# Patient Record
Sex: Female | Born: 2000
Health system: Southern US, Community
[De-identification: ages and names within clinical notes are randomized; demographics above are authoritative.]

## PROBLEM LIST (undated history)

## (undated) DIAGNOSIS — F980 Enuresis not due to a substance or known physiological condition: Secondary | ICD-10-CM

## (undated) DIAGNOSIS — K5090 Crohn's disease, unspecified, without complications: Secondary | ICD-10-CM

## (undated) DIAGNOSIS — R0683 Snoring: Secondary | ICD-10-CM

## (undated) DIAGNOSIS — D649 Anemia, unspecified: Secondary | ICD-10-CM

## (undated) DIAGNOSIS — T7840XA Allergy, unspecified, initial encounter: Secondary | ICD-10-CM

## (undated) HISTORY — DX: Allergy, unspecified, initial encounter: T78.40XA

## (undated) HISTORY — PX: DENTAL SURGERY: SHX609

## (undated) HISTORY — DX: Anemia, unspecified: D64.9

## (undated) HISTORY — DX: Enuresis not due to a substance or known physiological condition: F98.0

## (undated) HISTORY — DX: Snoring: R06.83

---
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
## 2021-01-07 DIAGNOSIS — L089 Local infection of the skin and subcutaneous tissue, unspecified: Secondary | ICD-10-CM | POA: Diagnosis not present

## 2021-01-07 DIAGNOSIS — Z13220 Encounter for screening for lipoid disorders: Secondary | ICD-10-CM | POA: Diagnosis not present

## 2021-01-07 DIAGNOSIS — N898 Other specified noninflammatory disorders of vagina: Secondary | ICD-10-CM | POA: Diagnosis not present

## 2021-01-07 DIAGNOSIS — K5010 Crohn's disease of large intestine without complications: Secondary | ICD-10-CM | POA: Diagnosis not present

## 2021-01-07 DIAGNOSIS — Z1159 Encounter for screening for other viral diseases: Secondary | ICD-10-CM | POA: Diagnosis not present

## 2021-01-07 DIAGNOSIS — D509 Iron deficiency anemia, unspecified: Secondary | ICD-10-CM | POA: Diagnosis not present

## 2021-01-07 DIAGNOSIS — K122 Cellulitis and abscess of mouth: Secondary | ICD-10-CM | POA: Diagnosis not present

## 2021-01-07 DIAGNOSIS — Z0000 Encounter for general adult medical examination without abnormal findings: Secondary | ICD-10-CM | POA: Diagnosis not present

## 2021-01-08 DIAGNOSIS — N898 Other specified noninflammatory disorders of vagina: Secondary | ICD-10-CM | POA: Diagnosis not present

## 2021-03-05 ENCOUNTER — Ambulatory Visit: Payer: Medicaid Other | Admitting: Pediatrics

## 2021-03-19 ENCOUNTER — Encounter: Payer: Self-pay | Admitting: Pediatrics

## 2021-03-19 ENCOUNTER — Ambulatory Visit (INDEPENDENT_AMBULATORY_CARE_PROVIDER_SITE_OTHER): Payer: BC Managed Care – PPO | Admitting: Pediatrics

## 2021-03-19 ENCOUNTER — Other Ambulatory Visit: Payer: Self-pay

## 2021-03-19 VITALS — BP 112/73 | HR 75 | Ht 68.0 in | Wt 155.6 lb

## 2021-03-19 DIAGNOSIS — Z3046 Encounter for surveillance of implantable subdermal contraceptive: Secondary | ICD-10-CM | POA: Diagnosis not present

## 2021-03-19 DIAGNOSIS — F4322 Adjustment disorder with anxiety: Secondary | ICD-10-CM | POA: Diagnosis not present

## 2021-03-19 MED ORDER — LEVONORGESTREL 1.5 MG PO TABS: 1.5000 mg | ORAL_TABLET | Freq: Once | ORAL | 0 refills | Status: AC

## 2021-03-19 NOTE — Patient Instructions (Signed)
Let Sonya Malone know if your mood symptoms aren't improving!   Your Nexplanon was removed today and is no longer preventing pregnancy.  If you have sex, remember to use condoms to prevent pregnancy and to prevent sexually transmitted infections.  Leave the outside bandage on for 24 hours.  Leave the smaller bandages on for 3-5 days or until they fall off on their own.  Keep the area clean and dry for 3-5 days.  There is usually bruising or swelling at and around the removal site for a few days to a week after the removal.  If you see redness or pus draining from the removal site, call Sonya Malone immediately.  We would like you to return to the clinic for a follow-up visit in 1 month.  You can call [facility] 24 hours a day with any questions or concerns.  There is always a nurse or doctor available to take your call.  Call 9-1-1 if you have a life-threatening emergency.  For anything else, please call Sonya Malone at [phone] before heading to the ER.

## 2021-03-19 NOTE — Progress Notes (Signed)
History was provided by the patient.  Sonya Malone is a 20 y.o. female who is here for nexplanon removal.  [name], Andres, MD   HPI:  Pt reports she feels like nexplanon is causing some anxiety and depression symptoms. Not currently sexually active. Last was July. Wants to take break from hormones and see how things are. Would like plan B at home if she needs it.   Was seeing therapist but it was not a good fit. Not taking SSRI- made her super numb. Working at [employer] last menstrual period was 03/16/2021 (exact date).    Patient Active Problem List   Diagnosis Date Noted   Crohn's disease of small intestine without complication ([place]) 81/15/7262   Nexplanon in place 04/27/2017   Adjustment disorder with anxious mood    Terminal ileitis ([place]) 08/03/2016    Current Outpatient Medications on File Prior to Visit  Medication Sig Dispense Refill   albuterol (VENTOLIN HFA) 108 (90 Base) MCG/ACT inhaler Inhale 2 puffs into the lungs every 4 (four) hours as needed for wheezing. 2 each 1   Clindamycin-Benzoyl Per, Refr, gel Apply 1 application topically 2 (two) times daily. 45 g 3   clindamycin-benzoyl peroxide (BENZACLIN) gel Apply 1 application topically 2 (two) times daily as needed (unk). 25 g 12   etonogestrel (NEXPLANON) 68 MG IMPL implant 1 each by Subdermal route once.     mupirocin ointment (BACTROBAN) 2 % Apply 1 application topically 3 (three) times daily. 22 g 0   VITAMIN D PO Take 50,000 Units by mouth once a week.      ferrous sulfate 325 (65 FE) MG EC tablet Take 1 tablet by mouth every morning.     No current facility-administered medications on file prior to visit.    No Known Allergies  Physical Exam:    Vitals:   03/19/21 1005  BP: 112/73  Pulse: 75  Weight: 155 lb 9.6 oz (70.6 kg)  Height: 5' 8"  (1.727 m)    Blood pressure percentiles are not available for patients who are 18 years or older.  Physical Exam Vitals and nursing note reviewed.   Constitutional:      General: She is not in acute distress.    Appearance: She is well-developed.  Neck:     Thyroid: No thyromegaly.  Cardiovascular:     Rate and Rhythm: Normal rate and regular rhythm.     Heart sounds: No murmur heard. Pulmonary:     Breath sounds: Normal breath sounds.  Abdominal:     Palpations: Abdomen is soft. There is no mass.     Tenderness: There is no abdominal tenderness. There is no guarding.  Musculoskeletal:     Right lower leg: No edema.     Left lower leg: No edema.  Lymphadenopathy:     Cervical: No cervical adenopathy.  Skin:    General: Skin is warm.     Findings: No rash.  Neurological:     Mental Status: She is alert.     Comments: No tremor    Assessment/Plan: 1. Encounter for Nexplanon removal See procedure note. Tolerated well without issues. Plan b sent to the pharmacy if needed.   2. Adjustment disorder with anxious mood Advised if she continues to struggle with mood and anxiety to come back and see [place].   Return PRN.   Jonathon Resides, FNP

## 2021-03-19 NOTE — Progress Notes (Signed)
Risks & benefits of Nexplanon removal discussed. Consent form signed.  The patient denies any allergies to anesthetics or antiseptics.  Procedure: Pt was placed in supine position. left arm was flexed at the elbow and externally rotated so that her wrist was parallel to her ear, The device was palpated and marked. The site was cleaned with Betadine. The area surrounding the device was covered with a sterile drape. 1% lidocaine was injected just under the device. A scalpel was used to create a small incision. The device was pushed towards the incision. Fibrous tissue surrounding the device was gradually removed from the device. The device was removed and measured to ensure all 4 cm of device was removed. Steri-strips were used to close the incision. Pressure dressing was applied to the patient.  The patient was instructed to removed the pressure dressing in 24 hrs.  The patient was advised to move slowly from a supine to an upright position  The patient denied any concerns or complaints  The patient was instructed to schedule a follow-up appt in 1 month. The patient will be called in 1 week to address any concerns.

## 2021-08-31 DIAGNOSIS — Z30011 Encounter for initial prescription of contraceptive pills: Secondary | ICD-10-CM | POA: Diagnosis not present

## 2021-08-31 DIAGNOSIS — R41840 Attention and concentration deficit: Secondary | ICD-10-CM | POA: Diagnosis not present

## 2021-11-30 ENCOUNTER — Encounter: Payer: Self-pay | Admitting: Pediatrics

## 2021-12-07 DIAGNOSIS — J358 Other chronic diseases of tonsils and adenoids: Secondary | ICD-10-CM | POA: Diagnosis not present

## 2021-12-07 DIAGNOSIS — J029 Acute pharyngitis, unspecified: Secondary | ICD-10-CM | POA: Diagnosis not present

## 2021-12-07 DIAGNOSIS — J020 Streptococcal pharyngitis: Secondary | ICD-10-CM | POA: Diagnosis not present

## 2022-02-24 DIAGNOSIS — J0391 Acute recurrent tonsillitis, unspecified: Secondary | ICD-10-CM | POA: Diagnosis not present

## 2022-04-16 DIAGNOSIS — J384 Edema of larynx: Secondary | ICD-10-CM | POA: Diagnosis not present

## 2022-04-16 DIAGNOSIS — J353 Hypertrophy of tonsils with hypertrophy of adenoids: Secondary | ICD-10-CM | POA: Diagnosis not present

## 2022-04-16 DIAGNOSIS — J358 Other chronic diseases of tonsils and adenoids: Secondary | ICD-10-CM | POA: Diagnosis not present

## 2022-07-06 DIAGNOSIS — J3501 Chronic tonsillitis: Secondary | ICD-10-CM | POA: Diagnosis not present

## 2022-07-06 DIAGNOSIS — J358 Other chronic diseases of tonsils and adenoids: Secondary | ICD-10-CM | POA: Diagnosis not present

## 2022-07-06 DIAGNOSIS — J353 Hypertrophy of tonsils with hypertrophy of adenoids: Secondary | ICD-10-CM | POA: Diagnosis not present

## 2022-12-27 DIAGNOSIS — L700 Acne vulgaris: Secondary | ICD-10-CM | POA: Diagnosis not present

## 2022-12-28 ENCOUNTER — Encounter: Payer: Self-pay | Admitting: Pediatrics

## 2022-12-29 DIAGNOSIS — L700 Acne vulgaris: Secondary | ICD-10-CM | POA: Diagnosis not present

## 2023-01-10 DIAGNOSIS — Z30430 Encounter for insertion of intrauterine contraceptive device: Secondary | ICD-10-CM | POA: Diagnosis not present

## 2023-01-10 DIAGNOSIS — Z3202 Encounter for pregnancy test, result negative: Secondary | ICD-10-CM | POA: Diagnosis not present

## 2023-01-27 DIAGNOSIS — L700 Acne vulgaris: Secondary | ICD-10-CM | POA: Diagnosis not present

## 2023-03-08 DIAGNOSIS — L700 Acne vulgaris: Secondary | ICD-10-CM | POA: Diagnosis not present

## 2023-03-30 DIAGNOSIS — L700 Acne vulgaris: Secondary | ICD-10-CM | POA: Diagnosis not present

## 2023-04-07 DIAGNOSIS — L700 Acne vulgaris: Secondary | ICD-10-CM | POA: Diagnosis not present

## 2023-04-16 DIAGNOSIS — R0981 Nasal congestion: Secondary | ICD-10-CM | POA: Diagnosis not present

## 2023-04-16 DIAGNOSIS — R051 Acute cough: Secondary | ICD-10-CM | POA: Diagnosis not present

## 2023-04-16 DIAGNOSIS — J069 Acute upper respiratory infection, unspecified: Secondary | ICD-10-CM | POA: Diagnosis not present

## 2023-05-02 DIAGNOSIS — L700 Acne vulgaris: Secondary | ICD-10-CM | POA: Diagnosis not present

## 2023-05-11 DIAGNOSIS — L700 Acne vulgaris: Secondary | ICD-10-CM | POA: Diagnosis not present

## 2023-06-10 DIAGNOSIS — L700 Acne vulgaris: Secondary | ICD-10-CM | POA: Diagnosis not present

## 2023-06-13 ENCOUNTER — Encounter: Payer: Self-pay | Admitting: Nurse Practitioner

## 2023-06-13 DIAGNOSIS — L700 Acne vulgaris: Secondary | ICD-10-CM | POA: Diagnosis not present

## 2023-06-16 DIAGNOSIS — R768 Other specified abnormal immunological findings in serum: Secondary | ICD-10-CM | POA: Diagnosis not present

## 2023-06-16 DIAGNOSIS — K50019 Crohn's disease of small intestine with unspecified complications: Secondary | ICD-10-CM | POA: Diagnosis not present

## 2023-06-16 DIAGNOSIS — R41840 Attention and concentration deficit: Secondary | ICD-10-CM | POA: Diagnosis not present

## 2023-06-16 DIAGNOSIS — F329 Major depressive disorder, single episode, unspecified: Secondary | ICD-10-CM | POA: Diagnosis not present

## 2023-07-08 DIAGNOSIS — L700 Acne vulgaris: Secondary | ICD-10-CM | POA: Diagnosis not present

## 2023-07-14 DIAGNOSIS — L700 Acne vulgaris: Secondary | ICD-10-CM | POA: Diagnosis not present

## 2023-07-18 DIAGNOSIS — Z6825 Body mass index (BMI) 25.0-25.9, adult: Secondary | ICD-10-CM | POA: Diagnosis not present

## 2023-07-18 DIAGNOSIS — F411 Generalized anxiety disorder: Secondary | ICD-10-CM | POA: Diagnosis not present

## 2023-07-18 DIAGNOSIS — F329 Major depressive disorder, single episode, unspecified: Secondary | ICD-10-CM | POA: Diagnosis not present

## 2023-07-27 NOTE — Progress Notes (Unsigned)
 07/27/2023 Sonya Malone [id number] [birth year]   CHIEF COMPLAINT: Crohn's disease   HISTORY OF PRESENT ILLNESS: Sonya Malone is a 23 year old female with a past medical history of anxiety, depression and Crohn's disease diagnosed in 2018 initially treated with Remicade which eventually required laparoscopic ileocecectomy with primarily ileocolic anastomosis 09/10/2019 (approximately 10 cm of small intestine resected with the proximal 5 cm of ileum and 10 cm of small intestine and the terminal ileum appeared to be grossly involved). She presents to our office today to establish a new GI for Crohn's management. She has not seen a GI since she underwent ileocecectomy surgery 08/2019 at [facility]. She is accompanied by her mother.   She denies having any nausea or vomiting. No abdominal pain.   No NSAIDs  She was due for 6 month follow up post surgery.  Age 76.   She had Remicade prior to surgery, stopped working 2020.   No abdominal pain She passed instant diarrhea after eating or water x 1 yr.  Now, snake like stools, more formed stool debris like stools.  Milk or coffee increases BM faster Loose stool more than formed. Watery more than solid. 1 to 2 episodes daily, bad day 5 episodes once weekly.  She is able to eat a healthy diet.  Weight up a little 160 - 165 lbs  Flu fall 2024, a few [place], Allergies. No antibiotics     EGD 08/06/2016 by Dr. Adelene Amas: - Normal esophagus. Biopsied.  - Gastritis. Biopsied.  - No gross lesions in the second portion of the duodenum. Biopsied.  Colonoscopy 08/06/2016: - Erythematous mucosa in the cecum. Terminal ileum protruding into cecum with ulcer seen. Likely crohn's. Biopsied.  - The recto-sigmoid colon is normal. Biopsied.  - The descending colon is normal. Biopsied. - The transverse colon is normal. Biopsied. - The ascending colon was normal.  1. Duodenum, Biopsy - NORMAL DUODENAL MUCOSA. - NO FEATURES OF SPRUE, ACTIVE INFLAMMATION  OR GRANULOMAS. 2. Stomach, biopsy, Antral - ANTRAL MUCOSA WITH MILD CHRONIC GASTRITIS. - WARTHIN-STARRY STAIN NEGATIVE FOR HELICOBACTER PYLORI. - NO INTESTINAL METAPLASIA, DYSPLASIA OR MALIGNANCY. 3. Esophagus, biopsy - NORMAL SQUAMOUS MUCOSA. - NO FEATURES OF EOSINOPHILIC ESOPHAGITIS (LESS THAN 3 EOSINOPHILS PER HIGH POWER FIELD). 4. Colon, biopsy, Cecal - NORMAL COLONIC MUCOSA WITH BENIGN LYMPHOID AGGREGATE. - NO MICROSCOPIC COLITIS, ACTIVE INFLAMMATION OR GRANULOMAS. 5. Colon, biopsy, Transverse - NORMAL COLONIC MUCOSA WITH BENIGN LYMPHOID AGGREGATE. - NO MICROSCOPIC COLITIS, ACTIVE INFLAMMATION OR GRANULOMAS. 6. Colon, biopsy, Descending - NORMAL COLONIC MUCOSA WITH BENIGN LYMPHOID AGGREGATE. - NO MICROSCOPIC COLITIS, ACTIVE INFLAMMATION OR GRANULOMAS. 7. Colon, biopsy, Recto-Sigmoid - BENIGN COLONIC MUCOSA WITH BENIGN LYMPHOID AGGREGATE. - NO MICROSCOPIC COLITIS, ACTIVE INFLAMMATION OR GRANULOMAS.    EGD 08/31/2019 at [facility]: Findings:      The oropharynx was normal.       The examined esophagus was normal. Estimated blood loss was minimal.       Biopsies were taken with a cold forceps for histology.       No gross lesions were noted in the entire examined stomach. A small       patch of erythema/edema noted without bleeding present (<1cm) in the       examined stomach. Otherwise grossly normal. Estimated blood loss was       minimal. Biopsies were taken with a cold forceps for histology.       The examined duodenum was normal. Estimated blood loss was minimal.  Biopsies were taken with a cold forceps for histology.                                                                                Impression:     - Normal oropharynx.                         - Normal esophagus. Biopsied.                         - No gross lesions in the stomach. Biopsied.                         - Normal examined duodenum. Biopsied.    Colonoscopy 08/31/2019 at [facility]: Findings:      Skin tags  were found on perianal exam.       The colon (entire examined portion) appeared normal though poor cleanout       limited evaluation. Estimated blood loss was minimal. Biopsies were       taken with a cold forceps for histology.       A localized area of the ileocecal valve was severely congested and       unable to intubate. Again obscured by stool. Estimated blood loss was       minimal.                                                                                  Impressio      - Preparation of the colon was inadequate.                         - Perianal skin tags found on perianal exam.                         - The entire examined colon is normal. Biopsied.                         - Congested mucosa at the ileocecal valve.      IMAGE STUDIES:  Abdominal/Pelvic  MRI 04/01/2017: LOWER CHEST: Unremarkable.   ABDOMEN/PELVIS:   HEPATOBILIARY: Liver is normal in signal. No biliary ductal dilatation. Gallbladder is unremarkable  PANCREAS: Unremarkable.  SPLEEN: Unremarkable.  ADRENAL GLANDS: Unremarkable.  KIDNEYS/URETERS: Symmetric nephrograms without hydronephrosis.  BLADDER: Unremarkable.  BOWEL/PERITONEUM/RETROPERITONEUM: No evidence of bowel obstruction. Trace free fluid in the pelvis which is likely physiologic. Short segment of hyperenhancement in the terminal ileum spanning approximately 2 to 3 cm, decreased from prior, with improvement in previously seen wall thickening. Restricted diffusion of this area is also seen. No evidence of fistula or abscess formation.  VASCULATURE: Abdominal aorta within normal limits for patient's age. Unremarkable inferior  vena cava.  LYMPH NODES: Grouped lymph nodes in the right lower quadrant without adenopathy by size criteria  REPRODUCTIVE ORGANS: Uterus and ovaries are unremarkable.   BONES/SOFT TISSUES: Unremarkable.    IMPRESSION:  -- Mild enhancement of a short segment in the terminal ileum which is consistent with Crohn's disease,  however, this is greatly improved from prior exam. No evidence of fistula or abscess formation.   Abdominal/Pelvic MRI 518/2018: FINDINGS:  LOWER CHEST: Unremarkable.   ABDOMEN/PELVIS:  - HEPATOBILIARY: Unremarkable liver. No biliary ductal dilatation. Gallbladder is distended with layering sludge.  - PANCREAS: Unremarkable.  - SPLEEN: Unremarkable.  - ADRENAL GLANDS: Unremarkable.   - KIDNEYS/URETERS: No hydronephrosis.  - BLADDER: Unremarkable.   - BOWEL/PERITONEUM/RETROPERITONEUM: No bowel obstruction. No acute inflammatory process. Small volume free fluid within the right lower quadrant. Bowel wall thickening and edema of the terminal ileum spanning a length of approximately 18-22 cm. This is most profound at the terminus, just proximal to the ileocecal valve. The bowel lumen is significantly narrowed in this distal most portion of the terminal ileum, measuring 4-5 cm. Narrowing of the ileal lumen is also seen further proximally suggestive of stricture spanning a length of approximately 3-4 cm. There is also marked wall thickening and enhancement within this region (22:67). There is also likely mild involvement of the cecum at the ileocecal valve. No fluid collections or abscesses are demonstrated.   - VASCULATURE: Abdominal aorta within normal limits for patient's age. The portal vein, SMV, and splenic veins are patent. Unremarkable inferior vena cava.  - LYMPH NODES: Multiple prominent subcentimeter right lower quadrant lymph nodes, likely reactive.   - REPRODUCTIVE ORGANS: The uterus and ovaries are unremarkable.   BONES/SOFT TISSUES: Unremarkable.    IMPRESSION:  1.Bowel wall thickening and edema of the terminal ileum spanning a length of approximately 18 to 22 cm with stricture in the terminal most portion of the ileum, and also likely more proximal stricture spanning a length of approximately 3-4 cm. There is mild involvement of the cecum at the ileocecal valve. Findings are  consistent with history of Crohn's disease.   2.No focal fluid collection or abscess.    Past Medical History:  Diagnosis Date   Crohn's disease (HCC)    Nonorganic enuresis    Snoring 2011   ENT eval for poss OSA   Past Surgical History:  Procedure Laterality Date   COLONOSCOPY WITH PROPOFOL N/A 08/06/2016   Procedure: COLONOSCOPY WITH PROPOFOL;  Surgeon: Adelene Amas, MD;  Location[facility] ENDOSCOPY;  Service: Gastroenterology;  Laterality: N/A;   DENTAL SURGERY     ESOPHAGOGASTRODUODENOSCOPY (EGD) WITH PROPOFOL N/A 08/06/2016   Procedure: ESOPHAGOGASTRODUODENOSCOPY (EGD) WITH PROPOFOL;  Surgeon: Adelene Amas, MD;  Location[facility] ENDOSCOPY;  Service: Gastroenterology;  Laterality: N/A;   Social History:  Family History:    reports that she has never smoked. She has been exposed to tobacco smoke. She has never used smokeless tobacco. She reports that she does not drink alcohol and does not use drugs. family history includes Alcohol abuse in her maternal grandfather; Diabetes in her maternal grandmother; Hearing loss in her maternal grandfather. No Known Allergies    Outpatient Encounter Medications as of 07/28/2023  Medication Sig   albuterol (VENTOLIN HFA) 108 (90 Base) MCG/ACT inhaler Inhale 2 puffs into the lungs every 4 (four) hours as needed for wheezing.   Clindamycin-Benzoyl Per, Refr, gel Apply 1 application topically 2 (two) times daily.   clindamycin-benzoyl peroxide (BENZACLIN) gel Apply 1 application topically 2 (two) times  daily as needed (unk).   ferrous sulfate 325 (65 FE) MG EC tablet Take 1 tablet by mouth every morning.   mupirocin ointment (BACTROBAN) 2 % Apply 1 application topically 3 (three) times daily.   VITAMIN D PO Take 50,000 Units by mouth once a week.    No facility-administered encounter medications on file as of 07/28/2023.     REVIEW OF SYSTEMS:  Gen: Denies fever, sweats or chills. No weight loss.  CV: Denies chest pain, palpitations or  edema. Resp: Denies cough, shortness of breath of hemoptysis.  GI: Denies heartburn, dysphagia, stomach or lower abdominal pain. No diarrhea or constipation.  GU: Denies urinary burning, blood in urine, increased urinary frequency or incontinence. MS: Denies joint pain, muscles aches or weakness. Derm: Denies rash, itchiness, skin lesions or unhealing ulcers. Psych: Denies depression, anxiety, memory loss or confusion. Heme: Denies bruising, easy bleeding. Neuro:  Denies headaches, dizziness or paresthesias. Endo:  Denies any problems with DM, thyroid or adrenal function.  PHYSICAL EXAM: There were no vitals taken for this visit. General: in no acute distress. Head: Normocephalic and atraumatic. Eyes:  Sclerae non-icteric, conjunctive pink. Ears: Normal auditory acuity. Mouth: Dentition intact. No ulcers or lesions.  Neck: Supple, no lymphadenopathy or thyromegaly.  Lungs: Clear bilaterally to auscultation without wheezes, crackles or rhonchi. Heart: Regular rate and rhythm. No murmur, rub or gallop appreciated.  Abdomen: Soft, nontender, nondistended. No masses. No hepatosplenomegaly. Normoactive bowel sounds x 4 quadrants.  Rectal:  Musculoskeletal: Symmetrical with no gross deformities. Skin: Warm and dry. No rash or lesions on visible extremities. Extremities: No edema. Neurological: Alert oriented x 4, no focal deficits.  Psychological:  Alert and cooperative. Normal mood and affect.  ASSESSMENT AND PLAN:    CC:  Georgiann Hahn, MD

## 2023-07-28 ENCOUNTER — Other Ambulatory Visit (INDEPENDENT_AMBULATORY_CARE_PROVIDER_SITE_OTHER)

## 2023-07-28 ENCOUNTER — Ambulatory Visit: Payer: BC Managed Care – PPO | Admitting: Nurse Practitioner

## 2023-07-28 ENCOUNTER — Encounter: Payer: Self-pay | Admitting: Nurse Practitioner

## 2023-07-28 VITALS — BP 104/70 | HR 91 | Ht 68.0 in | Wt 171.0 lb

## 2023-07-28 DIAGNOSIS — E559 Vitamin D deficiency, unspecified: Secondary | ICD-10-CM

## 2023-07-28 DIAGNOSIS — K5090 Crohn's disease, unspecified, without complications: Secondary | ICD-10-CM

## 2023-07-28 LAB — CBC WITH DIFFERENTIAL/PLATELET
Basophils Absolute: 0 10*3/uL (ref 0.0–0.1)
Basophils Relative: 0.4 % (ref 0.0–3.0)
Eosinophils Absolute: 0.2 10*3/uL (ref 0.0–0.7)
Eosinophils Relative: 2.6 % (ref 0.0–5.0)
HCT: 38.9 % (ref 36.0–46.0)
Hemoglobin: 12.3 g/dL (ref 12.0–15.0)
Lymphocytes Relative: 20.8 % (ref 12.0–46.0)
Lymphs Abs: 1.5 10*3/uL (ref 0.7–4.0)
MCHC: 31.6 g/dL (ref 30.0–36.0)
MCV: 78.2 fl (ref 78.0–100.0)
Monocytes Absolute: 0.5 10*3/uL (ref 0.1–1.0)
Monocytes Relative: 7 % (ref 3.0–12.0)
Neutro Abs: 5.1 10*3/uL (ref 1.4–7.7)
Neutrophils Relative %: 69.2 % (ref 43.0–77.0)
Platelets: 246 10*3/uL (ref 150.0–400.0)
RBC: 4.98 Mil/uL (ref 3.87–5.11)
RDW: 15.1 % (ref 11.5–15.5)
WBC: 7.4 10*3/uL (ref 4.0–10.5)

## 2023-07-28 LAB — COMPREHENSIVE METABOLIC PANEL WITH GFR
ALT: 34 U/L (ref 0–35)
AST: 22 U/L (ref 0–37)
Albumin: 4.8 g/dL (ref 3.5–5.2)
Alkaline Phosphatase: 63 U/L (ref 39–117)
BUN: 10 mg/dL (ref 6–23)
CO2: 25 meq/L (ref 19–32)
Calcium: 9.5 mg/dL (ref 8.4–10.5)
Chloride: 105 meq/L (ref 96–112)
Creatinine, Ser: 0.7 mg/dL (ref 0.40–1.20)
GFR: 122.94 mL/min (ref >60.00)
Glucose, Bld: 92 mg/dL (ref 70–99)
Potassium: 4.4 meq/L (ref 3.5–5.1)
Sodium: 140 meq/L (ref 135–145)
Total Bilirubin: 0.4 mg/dL (ref 0.2–1.2)
Total Protein: 7.5 g/dL (ref 6.0–8.3)

## 2023-07-28 LAB — IBC + FERRITIN
Ferritin: 4.3 ng/mL — ABNORMAL LOW (ref 10.0–291.0)
Iron: 31 ug/dL — ABNORMAL LOW (ref 42–145)
Saturation Ratios: 5.9 % — ABNORMAL LOW (ref 20.0–50.0)
TIBC: 522.2 ug/dL — ABNORMAL HIGH (ref 250.0–450.0)
Transferrin: 373 mg/dL — ABNORMAL HIGH (ref 212.0–360.0)

## 2023-07-28 LAB — C-REACTIVE PROTEIN: CRP: 1 mg/dL (ref 0.5–20.0)

## 2023-07-28 LAB — SEDIMENTATION RATE: Sed Rate: 19 mm/h (ref 0–20)

## 2023-07-28 LAB — VITAMIN D 25 HYDROXY (VIT D DEFICIENCY, FRACTURES): VITD: 13.89 ng/mL — ABNORMAL LOW (ref 30.00–100.00)

## 2023-07-28 NOTE — Patient Instructions (Addendum)
 Your provider has requested that you go to the basement level for lab work before leaving today. Press "B" on the elevator. The lab is located at the first door on the left as you exit the elevator.  Further recommendations to be determined once lab results are reviewed.   Due to recent changes in healthcare laws, you may see the results of your imaging and laboratory studies on MyChart before your provider has had a chance to review them.  We understand that in some cases there may be results that are confusing or concerning to you. Not all laboratory results come back in the same time frame and the provider may be waiting for multiple results in order to interpret others.  Please give [place] 48 hours in order for your provider to thoroughly review all the results before contacting the office for clarification of your results.   Thank you for trusting me with your gastrointestinal care!   Alcide Evener, CRNP

## 2023-08-01 ENCOUNTER — Other Ambulatory Visit

## 2023-08-01 DIAGNOSIS — K5090 Crohn's disease, unspecified, without complications: Secondary | ICD-10-CM | POA: Diagnosis not present

## 2023-08-03 LAB — CALPROTECTIN, FECAL: Calprotectin, Fecal: 117 ug/g (ref 0–120)

## 2023-08-08 NOTE — Addendum Note (Signed)
 Addended by: Corine Dice on: 08/08/2023 08:08 AM   Modules accepted: Orders

## 2023-08-08 NOTE — Progress Notes (Signed)
Orders were entered.

## 2023-08-11 NOTE — Progress Notes (Signed)
 Attempted to contact patient and left a voicemail for patient to return call.

## 2023-08-16 DIAGNOSIS — L700 Acne vulgaris: Secondary | ICD-10-CM | POA: Diagnosis not present

## 2023-08-21 NOTE — Progress Notes (Signed)
 Addendum: Reviewed and agree with assessment and management plan. New patient with history of ileal Crohn's disease status post resection.  No longer on biologic therapy postsurgery. Her fecal calprotectin is on the high end of borderline high I am suspicious for recurrence Would recommend MR enterography and colonoscopy if patient agreeable Yvaine Jankowiak, Amber Bail, MD

## 2023-08-21 NOTE — Progress Notes (Signed)
 Linda/Dottie, pls contact patient and let her know Dr. Marietta Shorter recommendation noted in addendum below. Pls schedule her for MR enterography and colonoscopy a colonoscopy at [facility] if she is agreeable to do so. THX.

## 2023-08-22 ENCOUNTER — Telehealth: Payer: Self-pay

## 2023-08-22 ENCOUNTER — Other Ambulatory Visit: Payer: Self-pay

## 2023-08-22 DIAGNOSIS — K5090 Crohn's disease, unspecified, without complications: Secondary | ICD-10-CM

## 2023-08-22 NOTE — Telephone Encounter (Signed)
 Pt notified via mychart. MR entero ordered. Awaiting comm back from pt to see if ok to proceed with colon.

## 2023-08-29 DIAGNOSIS — K50019 Crohn's disease of small intestine with unspecified complications: Secondary | ICD-10-CM | POA: Diagnosis not present

## 2023-08-29 DIAGNOSIS — Z6825 Body mass index (BMI) 25.0-25.9, adult: Secondary | ICD-10-CM | POA: Diagnosis not present

## 2023-08-29 DIAGNOSIS — F329 Major depressive disorder, single episode, unspecified: Secondary | ICD-10-CM | POA: Diagnosis not present

## 2023-08-29 DIAGNOSIS — F411 Generalized anxiety disorder: Secondary | ICD-10-CM | POA: Diagnosis not present

## 2023-09-02 ENCOUNTER — Ambulatory Visit (HOSPITAL_COMMUNITY)

## 2023-09-02 ENCOUNTER — Ambulatory Visit (HOSPITAL_COMMUNITY): Admission: RE | Admit: 2023-09-02 | Source: Ambulatory Visit

## 2023-09-08 ENCOUNTER — Ambulatory Visit (AMBULATORY_SURGERY_CENTER)

## 2023-09-08 VITALS — Ht 69.0 in | Wt 165.0 lb

## 2023-09-08 DIAGNOSIS — K5090 Crohn's disease, unspecified, without complications: Secondary | ICD-10-CM

## 2023-09-08 MED ORDER — NA SULFATE-K SULFATE-MG SULF 17.5-3.13-1.6 GM/177ML PO SOLN
1.0000 | Freq: Once | ORAL | 0 refills | Status: AC
Start: 2023-09-08 — End: 2023-09-08

## 2023-09-08 NOTE — Progress Notes (Signed)
No egg or soy allergy known to patient  No issues known to pt with past sedation with any surgeries or procedures Patient denies ever being told they had issues or difficulty with intubation  No FH of Malignant Hyperthermia Pt is not on diet pills Pt is not on  home 02  Pt is not on blood thinners  Pt denies issues with constipation  No A fib or A flutter Have any cardiac testing pending--no Pt can ambulate independntly Pt denies use of chewing tobacco Discussed diabetic I weight loss medication holds Discussed NSAID holds Checked BMI Pt instructed to use Singlecare[employer]com or GoodRx for a price reduction on prep  Patient's chart reviewed by Cathlyn Parsons CNRA prior to previsit and patient appropriate for the [facility].  Pre visit completed and red dot placed by patient's name on their procedure day (on provider's schedule).

## 2023-09-19 DIAGNOSIS — L700 Acne vulgaris: Secondary | ICD-10-CM | POA: Diagnosis not present

## 2023-09-21 ENCOUNTER — Encounter: Payer: Self-pay | Admitting: Internal Medicine

## 2023-09-27 ENCOUNTER — Encounter: Admitting: Internal Medicine

## 2023-09-30 ENCOUNTER — Encounter: Admitting: Internal Medicine

## 2023-10-05 ENCOUNTER — Ambulatory Visit: Admitting: Internal Medicine

## 2023-10-05 ENCOUNTER — Encounter: Payer: Self-pay | Admitting: Internal Medicine

## 2023-10-05 VITALS — BP 112/64 | HR 69 | Temp 98.1°F | Resp 12 | Ht 69.0 in | Wt 165.0 lb

## 2023-10-05 DIAGNOSIS — K9189 Other postprocedural complications and disorders of digestive system: Secondary | ICD-10-CM

## 2023-10-05 DIAGNOSIS — K639 Disease of intestine, unspecified: Secondary | ICD-10-CM | POA: Diagnosis not present

## 2023-10-05 DIAGNOSIS — K5000 Crohn's disease of small intestine without complications: Secondary | ICD-10-CM

## 2023-10-05 DIAGNOSIS — K6389 Other specified diseases of intestine: Secondary | ICD-10-CM

## 2023-10-05 MED ORDER — SODIUM CHLORIDE 0.9 % IV SOLN: 500.0000 mL | Freq: Once | INTRAVENOUS | Status: DC

## 2023-10-05 NOTE — Progress Notes (Signed)
 Called to room to assist during endoscopic procedure.  Patient ID and intended procedure confirmed with present staff. Received instructions for my participation in the procedure from the performing physician.

## 2023-10-05 NOTE — Op Note (Signed)
 [facility] Patient Name: Sonya Malone Procedure Date: 10/05/2023 3:00 PM MRN: [id number] Endoscopist: Nannette Babe , MD, [id number] Age: 23 Referring MD:  Date of Birth: 2000/04/21 Gender: Female Account #: [id number] Procedure:                Colonoscopy Indications:              Follow-up of Crohn's disease of the small bowel;                            Diagnosis with ileal Crohn's disease April 2018                            having used infliximab but then went for                            ileocecectomy May 2021 with no subsequent Crohn's                            therapy. Recent fecal calprotectin borderline                            elevated at 117, iron deficiency without anemia. No                            current GI complaints. Medicines:                Monitored Anesthesia Care Procedure:                Pre-Anesthesia Assessment:                           - Prior to the procedure, a History and Physical                            was performed, and patient medications and                            allergies were reviewed. The patient's tolerance of                            previous anesthesia was also reviewed. The risks                            and benefits of the procedure and the sedation                            options and risks were discussed with the patient.                            All questions were answered, and informed consent                            was obtained. Prior Anticoagulants: The patient has  taken no anticoagulant or antiplatelet agents. ASA                            Grade Assessment: II - A patient with mild systemic                            disease. After reviewing the risks and benefits,                            the patient was deemed in satisfactory condition to                            undergo the procedure.                           After obtaining informed consent, the colonoscope                             was passed under direct vision. Throughout the                            procedure, the patient's blood pressure, pulse, and                            oxygen saturations were monitored continuously. The                            PCF-[id number] Slim SN [id number] was introduced through                            the anus and advanced to the terminal ileum. The                            colonoscopy was performed without difficulty. The                            patient tolerated the procedure well. The quality                            of the bowel preparation was good. The terminal                            ileum and the rectum were photographed. Scope In: 3:05:53 PM Scope Out: 3:19:09 PM Scope Withdrawal Time: 0 hours 9 minutes 21 seconds  Total Procedure Duration: 0 hours 13 minutes 16 seconds  Findings:                 Scattered inflammation characterized by shallow                            ulcerations was found in the neo-terminal Ileum.                            The inflammation was graded as Rutgeerts Score i2                            (  more than five aphthous lesions with normal                            intervening mucosa or skip areas of larger lesions                            or lesions confined to the ileocolonic                            anastomosis). Biopsies were taken with a cold                            forceps for histology.                           There was evidence of a prior end-to-end                            colo-colonic anastomosis in the ascending colon.                            This was patent and was characterized by                            inflammation and an intact appearance. The                            anastomosis was traversed.                           The colon (entire examined portion) appeared normal.                           The retroflexed view of the distal rectum and anal                             verge was normal and showed no anal or rectal                            abnormalities. Complications:            No immediate complications. Estimated Blood Loss:     Estimated blood loss: none. Impression:               - Crohn's disease with ileitis. Inflammation was                            found. This was graded as Rutgeerts Score i2 (more                            than five aphthous lesions with normal intervening                            mucosa or skip areas of larger lesions or lesions  confined to the ileocolonic anastomosis). Biopsied.                           - Patent end-to-end colo-colonic anastomosis in                            ascending colon, characterized by inflammation and                            an intact appearance.                           - The entire examined colon is normal.                           - The distal rectum and anal verge are normal on                            retroflexion view. Recommendation:           - Patient has a contact number available for                            emergencies. The signs and symptoms of potential                            delayed complications were discussed with the                            patient. Return to normal activities tomorrow.                            Written discharge instructions were provided to the                            patient.                           - Resume previous diet.                           - Continue present medications.                           - Await pathology results.                           - Office visit with me in Aug or Sept. Plan to                            discuss possible changes in therapy to treat                            Crohn's disease. Nannette Babe, MD 10/05/2023 3:26:29 PM This report has been signed electronically.

## 2023-10-05 NOTE — Progress Notes (Signed)
 Pt A/O x 3, gd SR's, pleased with anesthesia, report to RN

## 2023-10-05 NOTE — Progress Notes (Signed)
 Vitals-Autumn  Pt's states no medical or surgical changes since previsit or office visit.

## 2023-10-05 NOTE — Patient Instructions (Signed)
 Resume previous diet Continue current medications Await pathology results Office visit with me in August or September.  Plan to discuss possible changes in therapy to treat Crohn's disease     YOU HAD AN ENDOSCOPIC PROCEDURE TODAY AT THE [facility]:   Refer to the procedure report that was given to you for any specific questions about what was found during the examination.  If the procedure report does not answer your questions, please call your gastroenterologist to clarify.  If you requested that your care partner not be given the details of your procedure findings, then the procedure report has been included in a sealed envelope for you to review at your convenience later.  YOU SHOULD EXPECT: Some feelings of bloating in the abdomen. Passage of more gas than usual.  Walking can help get rid of the air that was put into your GI tract during the procedure and reduce the bloating. If you had a lower endoscopy (such as a colonoscopy or flexible sigmoidoscopy) you may notice spotting of blood in your stool or on the toilet paper. If you underwent a bowel prep for your procedure, you may not have a normal bowel movement for a few days.  Please Note:  You might notice some irritation and congestion in your nose or some drainage.  This is from the oxygen used during your procedure.  There is no need for concern and it should clear up in a day or so.  SYMPTOMS TO REPORT IMMEDIATELY:  Following lower endoscopy (colonoscopy or flexible sigmoidoscopy):  Excessive amounts of blood in the stool  Significant tenderness or worsening of abdominal pains  Swelling of the abdomen that is new, acute  Fever of 100F or higher   For urgent or emergent issues, a gastroenterologist can be reached at any hour by calling [phone]. Do not use MyChart messaging for urgent concerns.    DIET:  We do recommend a small meal at first, but then you may proceed to your regular diet.  Drink plenty of  fluids but you should avoid alcoholic beverages for 24 hours.  ACTIVITY:  You should plan to take it easy for the rest of today and you should NOT DRIVE or use heavy machinery until tomorrow (because of the sedation medicines used during the test).    FOLLOW UP: Our staff will call the number listed on your records the next business day following your procedure.  We will call around 7:15- 8:00 am to check on you and address any questions or concerns that you may have regarding the information given to you following your procedure. If we do not reach you, we will leave a message.     If any biopsies were taken you will be contacted by phone or by letter within the next 1-3 weeks.  Please call us  at [phone] if you have not heard about the biopsies in 3 weeks.    SIGNATURES/CONFIDENTIALITY: You and/or your care partner have signed paperwork which will be entered into your electronic medical record.  These signatures attest to the fact that that the information above on your After Visit Summary has been reviewed and is understood.  Full responsibility of the confidentiality of this discharge information lies with you and/or your care-partner.

## 2023-10-05 NOTE — Progress Notes (Signed)
 GASTROENTEROLOGY PROCEDURE H&P NOTE   Primary Care Physician: Dickey Fought, PA    Reason for Procedure:  History of ileal Crohn's disease status post resection, borderline fecal calprotectin and iron deficiency without anemia  Plan:    Colonoscopy  Patient is appropriate for endoscopic procedure(s) in the ambulatory ([facility]) setting.  The nature of the procedure, as well as the risks, benefits, and alternatives were carefully and thoroughly reviewed with the patient. Ample time for discussion and questions allowed. The patient understood, was satisfied, and agreed to proceed.     HPI: Sonya Malone is a 23 y.o. female who presents for colonoscopy.  Medical history as below.  Tolerated the prep.  No recent chest pain or shortness of breath.  No abdominal pain today.  Past Medical History:  Diagnosis Date   Allergy    Anemia    Crohn's disease (HCC)    Nonorganic enuresis    Snoring 2011   ENT eval for poss OSA    Past Surgical History:  Procedure Laterality Date   COLONOSCOPY WITH PROPOFOL  N/A 08/06/2016   Procedure: COLONOSCOPY WITH PROPOFOL ;  Surgeon: Calvert Caul, MD;  Location: [facility] ENDOSCOPY;  Service: Gastroenterology;  Laterality: N/A;   DENTAL SURGERY     ESOPHAGOGASTRODUODENOSCOPY (EGD) WITH PROPOFOL  N/A 08/06/2016   Procedure: ESOPHAGOGASTRODUODENOSCOPY (EGD) WITH PROPOFOL ;  Surgeon: Calvert Caul, MD;  Location: [facility] ENDOSCOPY;  Service: Gastroenterology;  Laterality: N/A;    Prior to Admission medications   Medication Sig Start Date End Date Taking? Authorizing Provider  buPROPion  (WELLBUTRIN  XL) 300 MG 24 hr tablet Take 300 mg by mouth daily.   Yes [provider]  busPIRone (BUSPAR) 5 MG tablet Take 5 mg by mouth 3 (three) times daily.   Yes [provider]  levonorgestrel  (MIRENA ) 20 MCG/DAY IUD 1 each by Intrauterine route once.   Yes [provider]  albuterol  (VENTOLIN  HFA) 108 (90 Base) MCG/ACT inhaler Inhale 2 puffs into the  lungs. 03/21/20   [provider]  Clindamycin -Benzoyl Per, Refr, gel Apply 1 application topically 2 (two) times daily. Patient not taking: Reported on 07/28/2023 03/25/20   Rayann Cage, NP  clindamycin -benzoyl peroxide (BENZACLIN) gel Apply 1 application topically 2 (two) times daily as needed (unk). Patient not taking: Reported on 07/28/2023 03/21/20   Rayann Cage, NP  ferrous sulfate 325 (65 FE) MG EC tablet Take 1 tablet by mouth every morning. Patient not taking: Reported on 07/28/2023 01/10/21   [provider]  mupirocin  ointment (BACTROBAN ) 2 % Apply 1 application topically 3 (three) times daily. 03/21/20   Klett, Freya Jesus, NP  VITAMIN D  PO Take 50,000 Units by mouth once a week.  Patient not taking: Reported on 07/28/2023    [provider]    Current Outpatient Medications  Medication Sig Dispense Refill   buPROPion  (WELLBUTRIN  XL) 300 MG 24 hr tablet Take 300 mg by mouth daily.     busPIRone (BUSPAR) 5 MG tablet Take 5 mg by mouth 3 (three) times daily.     levonorgestrel  (MIRENA ) 20 MCG/DAY IUD 1 each by Intrauterine route once.     albuterol  (VENTOLIN  HFA) 108 (90 Base) MCG/ACT inhaler Inhale 2 puffs into the lungs.     Clindamycin -Benzoyl Per, Refr, gel Apply 1 application topically 2 (two) times daily. (Patient not taking: Reported on 07/28/2023) 45 g 3   clindamycin -benzoyl peroxide (BENZACLIN) gel Apply 1 application topically 2 (two) times daily as needed (unk). (Patient not taking: Reported on 07/28/2023) 25 g 12  ferrous sulfate 325 (65 FE) MG EC tablet Take 1 tablet by mouth every morning. (Patient not taking: Reported on 07/28/2023)     mupirocin  ointment (BACTROBAN ) 2 % Apply 1 application topically 3 (three) times daily. 22 g 0   VITAMIN D  PO Take 50,000 Units by mouth once a week.  (Patient not taking: Reported on 07/28/2023)     Current Facility-Administered Medications  Medication Dose Route Frequency Provider Last Rate Last Admin   0.9 %  sodium  chloride infusion  500 mL Intravenous Once Nandigam, Kavitha V, MD        Allergies as of 10/05/2023 - Review Complete 10/05/2023  Allergen Reaction Noted   Bee pollen Cough 09/08/2023   Nickel Rash 09/08/2023    Family History  Problem Relation Age of Onset   Diabetes Maternal Grandmother        type II   Alcohol abuse Maternal Grandfather    Hearing loss Maternal Grandfather    Arthritis Neg Hx    Asthma Neg Hx    Birth defects Neg Hx    Cancer Neg Hx    COPD Neg Hx    Depression Neg Hx    Drug abuse Neg Hx    Early death Neg Hx    Heart disease Neg Hx    Hyperlipidemia Neg Hx    Hypertension Neg Hx    Kidney disease Neg Hx    Learning disabilities Neg Hx    Mental illness Neg Hx    Mental retardation Neg Hx    Miscarriages / Stillbirths Neg Hx    Stroke Neg Hx    Vision loss Neg Hx    Colon cancer Neg Hx    Colon polyps Neg Hx    Esophageal cancer Neg Hx    Stomach cancer Neg Hx    Rectal cancer Neg Hx     Social History   Socioeconomic History   Marital status: Single    Spouse name: Not on file   Number of children: Not on file   Years of education: Not on file   Highest education level: Not on file  Occupational History   Occupation: shift lead  Tobacco Use   Smoking status: Never    Passive exposure: Yes   Smokeless tobacco: Never   Tobacco comments:    Father sometimes smokes in his car  Vaping Use   Vaping status: Never Used  Substance and Sexual Activity   Alcohol use: No   Drug use: No   Sexual activity: Never  Other Topics Concern   Not on file  Social History Narrative   11th grade   Social Drivers of Health   Financial Resource Strain: Low Risk  (09/05/2019)   Received from [facility]   Overall Financial Resource Strain (CARDIA)    Difficulty of Paying Living Expenses: Not very hard  Food Insecurity: No Food Insecurity (09/05/2019)   Received from [facility]   Hunger Vital Sign    Within the past 12 months, you worried  that your food would run out before you got the money to buy more.: Never true    Within the past 12 months, the food you bought just didn't last and you didn't have money to get more.: Never true  Transportation Needs: No Transportation Needs (09/05/2019)   Received from [facility]   PRAPARE - Transportation    Lack of Transportation (Medical): No    Lack of Transportation (Non-Medical): No  Physical Activity:  Not on file  Stress: Not on file  Social Connections: Not on file  Intimate Partner Violence: Not on file    Physical Exam: Vital signs in last 24 hours: @BP  124/73 (BP Location: Right Arm, Patient Position: Sitting, Cuff Size: Normal)   Pulse 90   Temp 98.1 F (36.7 C) (Temporal)   Ht 5' 9 (1.753 m)   Wt 165 lb (74.8 kg)   SpO2 97%   BMI 24.37 kg/m  GEN: NAD EYE: Sclerae anicteric ENT: MMM CV: Non-tachycardic Pulm: CTA b/l GI: Soft, NT/ND NEURO:  Alert & Oriented x 3   Laurell Pond, MD [facility]  10/05/2023 2:56 PM

## 2023-10-06 ENCOUNTER — Telehealth: Payer: Self-pay | Admitting: *Deleted

## 2023-10-06 NOTE — Telephone Encounter (Signed)
  Follow up Call-     10/05/2023    2:38 PM 10/05/2023    2:26 PM  Call back number  Post procedure Call Back phone  # [phone]   Permission to leave phone message  Yes     Patient questions:  Do you have a fever, pain , or abdominal swelling? No. Pain Score  0 *  Have you tolerated food without any problems? Yes.    Have you been able to return to your normal activities? Yes.    Do you have any questions about your discharge instructions: Diet   No. Medications  No. Follow up visit  No.  Do you have questions or concerns about your Care? No.  Actions: * If pain score is 4 or above: No action needed, pain <4.

## 2023-10-10 LAB — SURGICAL PATHOLOGY

## 2023-10-20 DIAGNOSIS — Z0000 Encounter for general adult medical examination without abnormal findings: Secondary | ICD-10-CM | POA: Diagnosis not present

## 2023-10-20 DIAGNOSIS — F411 Generalized anxiety disorder: Secondary | ICD-10-CM | POA: Diagnosis not present

## 2023-10-20 DIAGNOSIS — Z23 Encounter for immunization: Secondary | ICD-10-CM | POA: Diagnosis not present

## 2023-10-24 DIAGNOSIS — L700 Acne vulgaris: Secondary | ICD-10-CM | POA: Diagnosis not present

## 2023-10-24 DIAGNOSIS — Z0000 Encounter for general adult medical examination without abnormal findings: Secondary | ICD-10-CM | POA: Diagnosis not present

## 2023-10-26 ENCOUNTER — Ambulatory Visit: Payer: Self-pay | Admitting: Internal Medicine

## 2023-11-07 ENCOUNTER — Telehealth: Payer: Self-pay

## 2023-11-07 NOTE — Telephone Encounter (Signed)
-----   Message from [facility] Denyzha K sent at 08/08/2023  8:07 AM EDT ----- Regarding: Lab Vitamin D  level in 3 months. DX: Crohn's disease, iron deficiency and vitamin D  deficiency

## 2023-11-23 DIAGNOSIS — L700 Acne vulgaris: Secondary | ICD-10-CM | POA: Diagnosis not present

## 2023-12-07 ENCOUNTER — Ambulatory Visit (INDEPENDENT_AMBULATORY_CARE_PROVIDER_SITE_OTHER): Admitting: Internal Medicine

## 2023-12-07 ENCOUNTER — Encounter: Payer: Self-pay | Admitting: Internal Medicine

## 2023-12-07 VITALS — BP 112/68 | HR 92 | Ht 69.0 in | Wt 167.5 lb

## 2023-12-07 DIAGNOSIS — K5000 Crohn's disease of small intestine without complications: Secondary | ICD-10-CM

## 2023-12-07 DIAGNOSIS — E611 Iron deficiency: Secondary | ICD-10-CM | POA: Diagnosis not present

## 2023-12-07 DIAGNOSIS — D500 Iron deficiency anemia secondary to blood loss (chronic): Secondary | ICD-10-CM

## 2023-12-07 NOTE — Progress Notes (Signed)
 Subjective:    Patient ID: Sonya Malone, female    DOB: Mar 20, 2001, 23 y.o.   MRN: [id number]  HPI Sonya Malone is a 23 year old female with ileal Crohn's disease (diagnosed 2018 status post ileocecectomy with primary ileocolonic anastomosis May 2021, previously on infliximab)who presents for a follow-up colonoscopy. She is here alone today.  Over the last 2 days she has experienced mild right lower quadrant abdominal pain, particularly after eating a full meal.  The pain is described as a bloated feeling in the right lower quadrant. Her bowel movements have normalized over the past six months, becoming more solid, although she still experiences urgency after eating on some days. On bad days, she has bowel movements five to six times, which are looser. Recently, she noticed blood in her stool.  This is only been since the stools have been more formed.  She has had no anal pain with passing stool.  In the setting of her iron deficiency without anemia she recently started taking ferrous gluconate 324 mg daily. She has been on this supplement for about a week. She has not had a menstrual period since October due to an IUD, and she does not experience spotting. She reports feeling generally well with good energy levels.  Recent colonoscopy as below  She is working at [employer] on [employer].  Review of Systems As per HPI, otherwise negative  Current Medications, Allergies, Past Medical History, Past Surgical History, Family History and Social History were reviewed in [employer] record.     Objective:   Physical Exam BP 112/68   Pulse 92   Ht 5' 9 (1.753 m)   Wt 167 lb 8 oz (76 kg)   SpO2 98%   BMI 24.74 kg/m  Gen: awake, alert, NAD HEENT: anicteric  Abd: soft, NT/ND, +BS throughout Ext: no c/c/e Neuro: nonfocal  Fecal calprotectin 08/01/2023 borderline at 117 ESR 19 CRP 1.0 (of note CRP at diagnosis was  59)  DIAGNOSTIC Colonoscopy: Scattered inflammation with shallow ulcers in the neoterminal ileum, evidence of prior end-to-end colonic anastomosis in the ascending colon with inflammation, remainder of the colon appeared normal on direct views and retroflexion. (12/07/2023)  PATHOLOGY Ileum Biopsy: Intestinal mucosa with architectural disarray and focal reactive reparative type change, negative for definitive evidence of critical granulomas and viral cytopathic effects. (12/07/2023)  Partial bowel resection path report ileum and colon 09/10/2019: A: Ileum and colon, partial bowel resection - Active inflammatory bowel disease with extensive mucosal ulceration and stricture formation (see comment) - Transmural inflammation with serositis and early abscess formation - Subacute appendicitis with partial stricture and early abscess formation  - Proximal and distal bowel resection margins are viable and uninvolved by inflammatory bowel disease - Five lymph nodes with no histopathologic abnormality - No evidence of malignancy or dysplasia      Assessment & Plan:  23 year old with ileal Crohn's disease diagnosed in April 2018 treated with steroids, mesalamine  and then infliximab eventually having ileocecectomy for stricture in May 2021 who is here for follow-up  Crohn's disease of the ileum, post-ileocecectomy with mild recurrence Mild recurrence at the anastomosis site and in the neoterminal ileum with minor inflammation and ulcers.  The anastomotic inflammatory response may be also partially mildly ischemic. Symptoms include occasional right lower quadrant discomfort and changes in bowel habits. - Monitor symptoms and report any increase in right lower quadrant discomfort or other symptoms suggestive of Crohn's flare. - Consider budesonide  for 6-8 weeks if symptoms  worsen. - Discuss long-term medication options such as Skyrizi, Stelara, or Tremfya if budesonide  is ineffective or symptoms  recur.  Iron deficiency without anemia Iron deficiency with low iron levels, no anemia. Recently started on ferrous gluconate. Iron deficiency may affect stool consistency. - Continue ferrous gluconate daily for three months. - Re-evaluate iron levels with CBC, IBC, and ferritin in three months. - Monitor for constipation; consider Colace if needed.  Follow-up in 4 to 6 months  30 minutes total spent today including patient facing time, coordination of care, reviewing medical history/procedures/pertinent radiology studies, and documentation of the encounter.

## 2023-12-07 NOTE — Patient Instructions (Signed)
 I will contact you in 3 months to have you to come in for repeat labs.   If your right sided abdominal pain gets worse or you are concerned that it could be your Crohn's, please contact our office.   _______________________________________________________  If your blood pressure at your visit was 140/90 or greater, please contact your primary care physician to follow up on this.  _______________________________________________________  If you are age 22 or older, your body mass index should be between 23-30. Your Body mass index is 24.74 kg/m. If this is out of the aforementioned range listed, please consider follow up with your Primary Care Provider.  If you are age 4 or younger, your body mass index should be between 19-25. Your Body mass index is 24.74 kg/m. If this is out of the aformentioned range listed, please consider follow up with your Primary Care Provider.   ________________________________________________________  The [facility] providers would like to encourage you to use MYCHART to communicate with providers for non-urgent requests or questions.  Due to long hold times on the telephone, sending your provider a message by [facility] may be a faster and more efficient way to get a response.  Please allow 48 business hours for a response.  Please remember that this is for non-urgent requests.  _______________________________________________________  [facility] is using a team-based approach to care.  Your team is made up of your doctor and two to three APPS. Our APPS (Nurse Practitioners and Physician Assistants) work with your physician to ensure care continuity for you. They are fully qualified to address your health concerns and develop a treatment plan. They communicate directly with your gastroenterologist to care for you. Seeing the Advanced Practice Practitioners on your physician's team can help you by facilitating care more promptly, often allowing for earlier  appointments, access to diagnostic testing, procedures, and other specialty referrals.

## 2023-12-28 DIAGNOSIS — L700 Acne vulgaris: Secondary | ICD-10-CM | POA: Diagnosis not present

## 2024-01-31 DIAGNOSIS — F329 Major depressive disorder, single episode, unspecified: Secondary | ICD-10-CM | POA: Diagnosis not present

## 2024-01-31 DIAGNOSIS — L700 Acne vulgaris: Secondary | ICD-10-CM | POA: Diagnosis not present

## 2024-01-31 DIAGNOSIS — K130 Diseases of lips: Secondary | ICD-10-CM | POA: Diagnosis not present

## 2024-03-01 DIAGNOSIS — K130 Diseases of lips: Secondary | ICD-10-CM | POA: Diagnosis not present

## 2024-03-01 DIAGNOSIS — L853 Xerosis cutis: Secondary | ICD-10-CM | POA: Diagnosis not present

## 2024-03-01 DIAGNOSIS — L700 Acne vulgaris: Secondary | ICD-10-CM | POA: Diagnosis not present

## 2024-03-01 DIAGNOSIS — F329 Major depressive disorder, single episode, unspecified: Secondary | ICD-10-CM | POA: Diagnosis not present

## 2024-03-05 ENCOUNTER — Other Ambulatory Visit: Payer: Self-pay

## 2024-03-05 DIAGNOSIS — D500 Iron deficiency anemia secondary to blood loss (chronic): Secondary | ICD-10-CM

## 2024-04-04 DIAGNOSIS — L700 Acne vulgaris: Secondary | ICD-10-CM | POA: Diagnosis not present

## 2024-04-04 DIAGNOSIS — L853 Xerosis cutis: Secondary | ICD-10-CM | POA: Diagnosis not present

## 2024-04-04 DIAGNOSIS — F329 Major depressive disorder, single episode, unspecified: Secondary | ICD-10-CM | POA: Diagnosis not present

## 2024-04-04 DIAGNOSIS — K130 Diseases of lips: Secondary | ICD-10-CM | POA: Diagnosis not present
